# Patient Record
Sex: Male | Born: 1990 | Race: White | Hispanic: No | Marital: Single | Smoking: Never smoker
Health system: Southern US, Community
[De-identification: ages and names within clinical notes are randomized; demographics above are authoritative.]

## PROBLEM LIST (undated history)

## (undated) DIAGNOSIS — J45909 Unspecified asthma, uncomplicated: Secondary | ICD-10-CM

## (undated) DIAGNOSIS — E063 Autoimmune thyroiditis: Secondary | ICD-10-CM

## (undated) HISTORY — DX: Autoimmune thyroiditis: E06.3

## (undated) HISTORY — DX: Unspecified asthma, uncomplicated: J45.909

---
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
## 2015-08-30 ENCOUNTER — Other Ambulatory Visit: Payer: Self-pay | Admitting: *Deleted

## 2015-08-30 NOTE — Telephone Encounter (Signed)
FAXED REFILL TO [facility] FOR VENTOLIN HFA X 1 AND NEEDS APPT

## 2015-10-09 DIAGNOSIS — S93409A Sprain of unspecified ligament of unspecified ankle, initial encounter: Secondary | ICD-10-CM | POA: Diagnosis not present

## 2015-10-09 DIAGNOSIS — M25572 Pain in left ankle and joints of left foot: Secondary | ICD-10-CM | POA: Diagnosis not present

## 2015-11-15 DIAGNOSIS — E063 Autoimmune thyroiditis: Secondary | ICD-10-CM | POA: Diagnosis not present

## 2015-11-15 DIAGNOSIS — J45909 Unspecified asthma, uncomplicated: Secondary | ICD-10-CM | POA: Diagnosis not present

## 2015-11-16 ENCOUNTER — Encounter: Payer: Self-pay | Admitting: Allergy and Immunology

## 2015-11-16 ENCOUNTER — Ambulatory Visit (INDEPENDENT_AMBULATORY_CARE_PROVIDER_SITE_OTHER): Payer: BLUE CROSS/BLUE SHIELD | Admitting: Allergy and Immunology

## 2015-11-16 VITALS — BP 128/72 | HR 60 | Resp 16 | Ht 70.0 in | Wt 122.0 lb

## 2015-11-16 DIAGNOSIS — J4540 Moderate persistent asthma, uncomplicated: Secondary | ICD-10-CM

## 2015-11-16 MED ORDER — DULERA 200-5 MCG/ACT IN AERO: 2.0000 | INHALATION_SPRAY | Freq: Two times a day (BID) | RESPIRATORY_TRACT | Status: DC

## 2015-11-16 MED ORDER — VENTOLIN HFA 108 (90 BASE) MCG/ACT IN AERS: 2.0000 | INHALATION_SPRAY | RESPIRATORY_TRACT | Status: DC | PRN

## 2015-11-16 NOTE — Progress Notes (Signed)
Follow-up Note  Referring Provider: No ref. provider found Primary Provider: Sissy HoffSWAYNE,DAVID W, MD Date of Office Visit: 11/16/2015  Subjective:   Jerry Dalton (DOB: 10/14/1990) is a 25 y.o. male who [facility] in re-evaluation of the following:  HPI: Jerry Dalton returns to this clinic in evaluation of his asthma and history of fixed physiologic pulmonary deficit. He has done quite well over the course of the past year although he did develop a flare of his asthma during the spring that required him to increase his Dulera from once a day to twice a day and uses bronchodilator about twice a day. This occurred for about one week and possibly extending into his second week. Otherwise, he is done very well and is never required a systemic steroid for the past year to control this issue and does not use a short acting bronchodilator and can exercise without any difficulty.    Medication List           DULERA 200-5 MCG/ACT Aero  Generic drug:  mometasone-formoterol  Inhale 2 puffs into the lungs 2 (two) times daily.     VENTOLIN HFA 108 (90 Base) MCG/ACT inhaler  Generic drug:  albuterol  Inhale 2 puffs into the lungs every 4 (four) hours as needed.        Past Medical History  Diagnosis Date  . Asthma   . Hashimoto's thyroiditis     History reviewed. No pertinent past surgical history.  No Known Allergies  Review of systems negative except as noted in HPI / PMHx or noted below:  Review of Systems  Constitutional: Negative.   HENT: Negative.   Eyes: Negative.   Respiratory: Negative.   Cardiovascular: Negative.   Gastrointestinal: Negative.   Genitourinary: Negative.   Musculoskeletal: Negative.   Skin: Negative.   Neurological: Negative.   Endo/Heme/Allergies: Negative.   Psychiatric/Behavioral: Negative.      Objective:   Filed Vitals:   11/16/15 1803  BP: 128/72  Pulse: 60  Resp: 16   Height: 5\' 10"   (177.8 cm)  Weight: 122 lb (55.339 kg)   Physical Exam  Constitutional: He is well-developed, well-nourished, and in no distress.  HENT:  Head: Normocephalic.  Right Ear: Tympanic membrane, external ear and ear canal normal.  Left Ear: Tympanic membrane, external ear and ear canal normal.  Nose: Nose normal. No mucosal edema or rhinorrhea.  Mouth/Throat: Uvula is midline, oropharynx is clear and moist and mucous membranes are normal. No oropharyngeal exudate.  Eyes: Conjunctivae are normal.  Neck: Trachea normal. No tracheal tenderness present. No tracheal deviation present. No thyromegaly present.  Cardiovascular: Normal rate, regular rhythm, S1 normal, S2 normal and normal heart sounds.   No murmur heard. Pulmonary/Chest: Breath sounds normal. No stridor. No respiratory distress. He has no wheezes. He has no rales.  Musculoskeletal: He exhibits no edema.  Lymphadenopathy:       Head (right side): No tonsillar adenopathy present.       Head (left side): No tonsillar adenopathy present.    He has no cervical adenopathy.  Neurological: He is alert. Gait normal.  Skin: No rash noted. He is not diaphoretic. No erythema. Nails show no clubbing.  Psychiatric: Mood and affect normal.    Diagnostics:    Spirometry was performed and demonstrated an FEV1 of 3.14 at 70 % of predicted.  The patient had an Asthma Control Test with the following results: ACT Total Score: 20.  Assessment and Plan:   1. Asthma, moderate persistent, well-controlled     1. Continue Dulera 2 inhalations one or 2 times per day depending on disease activity  2. Continue ProAir HFA 2 puffs every 4-6 hours if needed  3. Obtain annual fall flu vaccine  4. Return to clinic in 1 year or earlier if problem  Jerry Dalton appears to be doing relative well on his current medical plan and I will see him back in this clinic in 1 year or earlier if there is a problem.  Laurette SchimkeEric Jaquanda Wickersham, MD [facility]

## 2015-11-16 NOTE — Patient Instructions (Signed)
  1. Continue Dulera 2 inhalations one or 2 times per day depending on disease activity  2. Continue ProAir HFA 2 puffs every 4-6 hours if needed  3. Obtain annual fall flu vaccine  4. Return to clinic in 1 year or earlier if problem

## 2015-12-14 DIAGNOSIS — J4520 Mild intermittent asthma, uncomplicated: Secondary | ICD-10-CM | POA: Diagnosis not present

## 2015-12-16 ENCOUNTER — Ambulatory Visit (INDEPENDENT_AMBULATORY_CARE_PROVIDER_SITE_OTHER): Payer: BLUE CROSS/BLUE SHIELD | Admitting: Allergy and Immunology

## 2015-12-16 ENCOUNTER — Encounter: Payer: Self-pay | Admitting: Allergy and Immunology

## 2015-12-16 ENCOUNTER — Encounter (INDEPENDENT_AMBULATORY_CARE_PROVIDER_SITE_OTHER): Payer: Self-pay

## 2015-12-16 ENCOUNTER — Other Ambulatory Visit: Payer: Self-pay | Admitting: Allergy and Immunology

## 2015-12-16 VITALS — BP 110/72 | HR 76 | Temp 97.5°F | Resp 16

## 2015-12-16 DIAGNOSIS — J45901 Unspecified asthma with (acute) exacerbation: Secondary | ICD-10-CM | POA: Diagnosis not present

## 2015-12-16 NOTE — Progress Notes (Signed)
FOLLOW UP NOTE  RE: Jerry Dalton MRN: [id number] DOB: Mar 20, 1991 [facility] [address] [place] [address] Date of Office Visit: 12/16/2015  Subjective:  Jerry Dalton is a 25 y.o. male who presents today for Asthma (Cough X 2 days---has used Albuterol six times in 2 days.   "Trouble catching my breath")  Assessment:   1. Persistent asthma with acute exacerbation, in no respiratory distress with normal lung exam and normal oxygenation.  2.      History of restrictive in office spirometry in this slim young man (fixed physiologic pulmonary deficit). 3.      Intermittent rhinitis. 4.      History of thyroiditis. Plan:  1.   Prednisone  today,  tomorrow and Saturday. 2.   Continue Dulera 2 puffs twice daily, rinse mouth with water after use. 4.   Consider further evaluation if persisting symptoms. 5.   Follow-up in 2 weeks or sooner if needed. 6.   Saline nasal wash each evening at bath time. 7.   Consider addition of Singulair  each evening. 8.   Follow-up with primary MD/Endocrine regarding thyroid and weight.  HPI: Jerry Dalton returns to the office after our On call conversation last night with recent cough and concern for taking in big deep breaths. He states all this week he has noted change in his breathing. He feels he needs to take in a "big breath" and think about his breathing.  He feels symptoms begin at work late in the day, where he is exposed to [employer] ([employer]) dust and recent humid weather. He has been working there since May but recently, this week symptoms began bothering him.  He may have noted rare cough but no wheeze, shortness of breath, difficulty in breathing, chest tightness or chest congestion.  He denies fever, sore throat, headache, nasal congestion, rhinorrhea, sneezing or chest pain.  He had used albuterol several times which is beneficial but not quite 100%.  He does not recall any sick  contacts.  He just saw Dr. Lucie Leather last month, and was doing well therefore to maintain on current medication regime.  Denies ED or urgent care visits, prednisone or antibiotic courses. Reports sleep and activity are normal. We did discuss his weight, and he reports normal appetite, and no GI concerns but weight loss over the recent years and has previously followed with Endocrinology.  Jerry Dalton has a current medication list which includes the following prescription(s): mometasone-formoterol and ventolin hfa.   Drug Allergies: No Known Allergies  Objective:   Vitals:   12/16/15 1455  BP: 110/72  Pulse: 76  Resp: 16  Temp: 97.5 F (36.4 C)   SpO2 Readings from Last 1 Encounters:  12/16/15 98%   Physical Exam  Constitutional: He is well-developed, well-nourished, and in no distress.  Alert interactive very slim young man communicating easily in full sentences.  HENT:  Head: Atraumatic.  Right Ear: Tympanic membrane and ear canal normal.  Left Ear: Tympanic membrane and ear canal normal.  Nose: Mucosal edema present. No rhinorrhea. No epistaxis.  Mouth/Throat: Oropharynx is clear and moist and mucous membranes are normal. No oropharyngeal exudate, posterior oropharyngeal edema or posterior oropharyngeal erythema.  Eyes: Conjunctivae are normal.  Neck: Neck supple.  Cardiovascular: Normal rate, S1 normal and S2 normal.   No murmur heard. Pulmonary/Chest: Effort normal and breath sounds normal. No accessory muscle usage. No respiratory distress. He has no decreased breath sounds. He has no  wheezes. He has no rhonchi. He has no rales.  Lymphadenopathy:    He has no cervical adenopathy.  Skin: Skin is warm and intact. No rash noted. No cyanosis. Nails show no clubbing.   Diagnostics: Spirometry:  FVC 3.59--69%, FEV1 3.25--72%, postbronchodilator essentially no change.  ACT=19.    Jerry Dalton M. Willa Rough, MD  cc: Sissy Hoff, MD

## 2015-12-16 NOTE — Patient Instructions (Addendum)
    Prednisone 30mg  today, 20mg  tomorrow and Saturday.  Continue Dulera 2 puffs twice daily.  Consider further evaluation if persisting symptoms.  Follow-up in 2 weeks or sooner if needed.  Saline nasal wash each evening at bath time.  Consider Singulair 10mg  each evening.  Follow-up with Endocrine regarding thyroid and weight.

## 2016-03-17 DIAGNOSIS — Z23 Encounter for immunization: Secondary | ICD-10-CM | POA: Diagnosis not present

## 2016-06-13 DIAGNOSIS — J069 Acute upper respiratory infection, unspecified: Secondary | ICD-10-CM | POA: Diagnosis not present

## 2016-11-05 ENCOUNTER — Other Ambulatory Visit: Payer: Self-pay | Admitting: Allergy and Immunology

## 2016-12-01 ENCOUNTER — Telehealth: Payer: Self-pay | Admitting: Allergy and Immunology

## 2016-12-01 NOTE — Telephone Encounter (Signed)
I tried to call Jerry Dalton but his voicemail is not set up. I can not call his mother back because their is no DPR on file.

## 2016-12-01 NOTE — Telephone Encounter (Signed)
Patient's mom called and made an appt for her sone for Monday, 7-16. He is at [employer] and is having some breathing issues. She said he could not come today, but Monday was fine. She needs advice on what he can do over the weekend if it becomes worse. I told her Urgent Care and she said he has gone there twice and they won't do anything for him and tell him to go to the hospital. She wants to know if there is an inhaler that could be called in or any advice you might have for him. She said you can either call her or, Jerry Dalton and it is ok to leave a message. [name]'s [phone]. Mom's [phone].

## 2016-12-04 ENCOUNTER — Ambulatory Visit (INDEPENDENT_AMBULATORY_CARE_PROVIDER_SITE_OTHER): Payer: BLUE CROSS/BLUE SHIELD | Admitting: Allergy

## 2016-12-04 ENCOUNTER — Encounter: Payer: Self-pay | Admitting: Allergy

## 2016-12-04 VITALS — BP 118/70 | HR 63 | Temp 98.0°F | Resp 18 | Ht 70.75 in | Wt 131.6 lb

## 2016-12-04 DIAGNOSIS — J4540 Moderate persistent asthma, uncomplicated: Secondary | ICD-10-CM | POA: Diagnosis not present

## 2016-12-04 MED ORDER — MOMETASONE FURO-FORMOTEROL FUM 200-5 MCG/ACT IN AERO: INHALATION_SPRAY | RESPIRATORY_TRACT | 5 refills | Status: DC

## 2016-12-04 MED ORDER — MONTELUKAST SODIUM 10 MG PO TABS: 10.0000 mg | ORAL_TABLET | Freq: Every day | ORAL | 5 refills | Status: DC

## 2016-12-04 MED ORDER — VENTOLIN HFA 108 (90 BASE) MCG/ACT IN AERS: 2.0000 | INHALATION_SPRAY | RESPIRATORY_TRACT | 1 refills | Status: DC | PRN

## 2016-12-04 NOTE — Progress Notes (Signed)
Follow-up Note  RE: Jerry Dalton MRN: [id number] DOB: 07/22/1990 Date of Office Visit: 12/04/2016   History of present illness: Jerry Dalton is a 26 y.o. male presenting today for follow-up of moderate persistent asthma. The patient's last visit was July 2017. At that time he was treated for an acute exacerbation with prednisone. Today the patient states that he has been experiencing increased shortness of breath, chest tightness, and cough over the past two weeks. He denies wheezing or night symptoms. He states his albuterol use has varied these past two weeks. He states some days he does not need to use it at all and other days he will use it 2-3 times per day. He endorses exercise as a trigger, but uses his albuterol inhaler after exercise rather than 30 minutes prior. One week ago he increased his Dulera from two puffs once daily to two puffs twice daily, which he states has helped. He endorses that his triggers include heat, exercise/physical exertion, and stress.   Since his last visit in July 2017, he has not had any ED visits, hospitalizations, or prednisone treatment for his asthma. The patient states that his allergies are very mild and have not been an issue.   Review of systems: Review of Systems  Constitutional: Negative.   HENT: Negative.   Eyes: Negative.   Respiratory: Positive for cough and shortness of breath.   Cardiovascular: Negative.   Gastrointestinal: Negative.   Genitourinary: Negative.   Musculoskeletal: Negative.   Skin: Negative.   Neurological: Negative.   Endo/Heme/Allergies: Negative.   Psychiatric/Behavioral: Negative.     All other systems negative unless noted above in HPI  Past medical/social/surgical/family history have been reviewed and are unchanged unless specifically indicated below.  No changes  Medication List: Allergies as of 12/04/2016   No Known Allergies     Medication List       Accurate as of 12/04/16  1:07 PM.  Always use your most recent med list.          mometasone-formoterol 200-5 MCG/ACT Aero Commonly known as:  DULERA INHALE TWO PUFFS TWICE DAILY TO PREVENT COUGH OR WHEEZE   VENTOLIN HFA 108 (90 Base) MCG/ACT inhaler Generic drug:  albuterol Inhale 2 puffs into the lungs every 4 (four) hours as needed.       Known medication allergies: No Known Allergies   Physical examination: Blood pressure 118/70, pulse 63, temperature 98 F (36.7 C), temperature source Oral, resp. rate 18, height 5' 10.75" (1.797 m), weight 131 lb 9.6 oz (59.7 kg), SpO2 97 %.  General: Alert, interactive, in no acute distress. HEENT: TMs pearly gray, turbinates non-edematous without discharge, post-pharynx unremarkable. Neck: Supple without lymphadenopathy. Lungs: Clear to auscultation without wheezing, rhonchi or rales. {no increased work of breathing. CV: Normal S1, S2 without murmurs. Abdomen: Nondistended, nontender. Skin: Warm and dry, without lesions or rashes. Extremities:  No clubbing, cyanosis or edema. Neuro:   Grossly intact.  Diagnositics/Labs Labs:  CBC, IgE, Environmental panel  Spirometry: FEV1: 2.75/59, FVC: 3.25/60%, ratio consistent with moderate restriction. No significant improvement post albuterol treatment.  Study similar from previous study  Allergy testing: None   Assessment and plan:   1. Moderate Persistent Asthma - at this time not well controlled -Continue Dulera 200 mcg two puffs twice daily. Albuterol as needed, and 30 minutes prior to physical activity -Start Singulair 10 mg daily.  -Obtain labs: CBC, IgE, and environmental panel - if lung function worsen or symptoms become more progressive  consider imaging with A1AT testing -Asthma control goals:   Full participation in all desired activities (may need albuterol before activity)  Albuterol use two time or less a week on average (not counting use with activity)  Cough interfering with sleep two time or less a  month  Oral steroids no more than once a year   2. Intermittent rhinitis  -Singulair 10 mg daily as above -Saline rinses daily   Follow up with Dr. Lucie Leather in 3-4 months.    Landis Martins, MD Internal Medicine PGY1  I performed a history and physical examination of the patient and discussed management with the resident. I reviewed the resident's note and agree with the documented findings and plan of care. The note in its entirety was edited by myself, including the physical exam, assessment, and plan.   Margo Aye, MD [facility]

## 2016-12-04 NOTE — Telephone Encounter (Signed)
Patient is in the office today being seen.

## 2016-12-04 NOTE — Patient Instructions (Addendum)
1. Moderate Persistent Asthma -Continue Dulera 200 mcg two puffs twice daily. Albuterol as needed, and 30 minutes prior to physical activity -Start Singulair 10 mg daily.  -Obtain labs: CBC, IgE, and environmental panel -Asthma control goals:   Full participation in all desired activities (may need albuterol before activity)  Albuterol use two time or less a week on average (not counting use with activity)  Cough interfering with sleep two time or less a month  Oral steroids no more than once a year   2. Intermittent rhinitis  -Singulair 10 mg daily as above -Saline rinses daily   Follow up with Dr. Lucie LeatherKozlow in 3-4 months.

## 2016-12-12 DIAGNOSIS — H52223 Regular astigmatism, bilateral: Secondary | ICD-10-CM | POA: Diagnosis not present

## 2016-12-29 DIAGNOSIS — Z0000 Encounter for general adult medical examination without abnormal findings: Secondary | ICD-10-CM | POA: Diagnosis not present

## 2017-06-09 ENCOUNTER — Other Ambulatory Visit: Payer: Self-pay | Admitting: Internal Medicine

## 2017-06-09 ENCOUNTER — Other Ambulatory Visit: Payer: Self-pay | Admitting: Allergy

## 2017-06-09 DIAGNOSIS — J4540 Moderate persistent asthma, uncomplicated: Secondary | ICD-10-CM

## 2017-06-11 NOTE — Telephone Encounter (Signed)
Courtesy refill  

## 2017-06-14 ENCOUNTER — Telehealth: Payer: Self-pay | Admitting: Allergy

## 2017-06-14 MED ORDER — MOMETASONE FURO-FORMOTEROL FUM 200-5 MCG/ACT IN AERO: INHALATION_SPRAY | RESPIRATORY_TRACT | 0 refills | Status: DC

## 2017-06-14 NOTE — Telephone Encounter (Signed)
[facility]

## 2017-06-14 NOTE — Telephone Encounter (Signed)
Pt called and needs to [facility] Dul[facility] [place] [phone].

## 2017-06-15 NOTE — Telephone Encounter (Signed)
Gave patient a sample of Dulera on 06/15/2017.

## 2017-07-08 ENCOUNTER — Other Ambulatory Visit: Payer: Self-pay | Admitting: Allergy and Immunology

## 2017-07-08 DIAGNOSIS — J4540 Moderate persistent asthma, uncomplicated: Secondary | ICD-10-CM

## 2017-07-20 ENCOUNTER — Telehealth: Payer: Self-pay | Admitting: Allergy

## 2017-07-20 MED ORDER — MOMETASONE FURO-FORMOTEROL FUM 200-5 MCG/ACT IN AERO: INHALATION_SPRAY | RESPIRATORY_TRACT | 0 refills | Status: DC

## 2017-07-20 NOTE — Telephone Encounter (Signed)
[facility] Will not refill if patient does not keep appointment.

## 2017-07-20 NOTE — Telephone Encounter (Signed)
Pt called and made appointment with anna on 03.6.2019. Needs to have Dulera [facility] [address] [phone].

## 2017-07-23 ENCOUNTER — Telehealth: Payer: Self-pay | Admitting: Allergy

## 2017-07-23 ENCOUNTER — Other Ambulatory Visit: Payer: Self-pay | Admitting: *Deleted

## 2017-07-23 DIAGNOSIS — J4540 Moderate persistent asthma, uncomplicated: Secondary | ICD-10-CM

## 2017-07-23 MED ORDER — MONTELUKAST SODIUM 10 MG PO TABS: ORAL_TABLET | ORAL | 0 refills | Status: DC

## 2017-07-23 NOTE — Telephone Encounter (Signed)
Rx sent 

## 2017-07-23 NOTE — Telephone Encounter (Signed)
Patient is requesting a refill on montelukast. He has an appt on 07-25-17.

## 2017-07-25 ENCOUNTER — Ambulatory Visit (INDEPENDENT_AMBULATORY_CARE_PROVIDER_SITE_OTHER): Payer: BLUE CROSS/BLUE SHIELD | Admitting: Family Medicine

## 2017-07-25 ENCOUNTER — Encounter: Payer: Self-pay | Admitting: Family Medicine

## 2017-07-25 VITALS — BP 118/70 | HR 71 | Resp 18

## 2017-07-25 DIAGNOSIS — Z8639 Personal history of other endocrine, nutritional and metabolic disease: Secondary | ICD-10-CM | POA: Insufficient documentation

## 2017-07-25 DIAGNOSIS — J310 Chronic rhinitis: Secondary | ICD-10-CM

## 2017-07-25 DIAGNOSIS — J4540 Moderate persistent asthma, uncomplicated: Secondary | ICD-10-CM

## 2017-07-25 MED ORDER — MONTELUKAST SODIUM 10 MG PO TABS: ORAL_TABLET | ORAL | 5 refills | Status: DC

## 2017-07-25 MED ORDER — MOMETASONE FURO-FORMOTEROL FUM 200-5 MCG/ACT IN AERO
INHALATION_SPRAY | RESPIRATORY_TRACT | 5 refills | Status: DC
Start: 2017-07-25 — End: 2018-03-07

## 2017-07-25 MED ORDER — VENTOLIN HFA 108 (90 BASE) MCG/ACT IN AERS: 2.0000 | INHALATION_SPRAY | RESPIRATORY_TRACT | 1 refills | Status: DC | PRN

## 2017-07-25 NOTE — Progress Notes (Signed)
[address] [place] [address] Dept: [phone]  FOLLOW UP NOTE  Patient ID: Jerry Dalton, male    DOB: 12/27/1990  Age: 27 y.o. MRN: [id number] Date of Office Visit: 07/25/2017  Assessment  Chief Complaint: Asthma and Medication Refill  HPI Jerry BishopBenjamin N Brandi is a 27 year old male who presents to the clinic today for follow-up.  He was last seen in this clinic on 12/04/2016 by Dr. Delorse LekPadgett for evaluation of moderate persistent asthma and allergic rhinitis.  At that time he was continued on Dulera 200-2 puffs twice a day and albuterol 2 puffs every 4 hours as needed.  At that visit, he was started on montelukast 10 mg once a day.  At today's visit, he reports he is doing well with his asthma and his rhinitis. Quamir's asthma has been well controlled. He has not required rescue medication, experienced nocturnal awakenings due to lower respiratory symptoms, nor have activities of daily living been limited. He has required no Emergency Department or Urgent Care visits for his asthma. He has required zero courses of systemic steroids for asthma exacerbations since the last visit. ACT score today is 22, indicating excellent asthma symptom control.  He reports his use of pro-air varies and he has used his ProAir inhaler 1 time in the last week for an episode of shortness of breath which provided relief.  Allergic rhinitis is reported as well controlled with no medical intervention.   Current medications include montelukast 10 mg once a day, Dulera 200-2 puffs twice a day, and albuterol inhaler 2 puffs every 4 hours as needed.   Drug Allergies:  No Known Allergies  Physical Exam: BP 118/70 (BP Location: Left Arm, Patient Position: Sitting)   Pulse 71   Resp 18   SpO2 95%    Physical Exam  Constitutional: He is oriented to person, place, and time. He appears well-developed and well-nourished.  HENT:  Right Ear: External ear normal.  Left Ear: External ear normal.    Nose: Nose normal.  Mouth/Throat: Oropharynx is clear and moist.  Eyes: Conjunctivae are normal.  Neck: Normal range of motion. Neck supple.  Cardiovascular: Normal rate, regular rhythm and normal heart sounds.  S1-S2 normal.  Regular heart rate and rhythm.  No murmur noted.  Pulmonary/Chest: Effort normal and breath sounds normal.  Lungs clear to auscultation  Musculoskeletal: Normal range of motion.  Neurological: He is alert and oriented to person, place, and time.  Skin: Skin is warm and dry.  Psychiatric: He has a normal mood and affect. His behavior is normal.    Diagnostics: FVC 3.85, FEV1 3.14.  Predicted FVC 5.01, predicted FEV1 3.73.  Spirometry indicates mild restriction which is similar to the previous visit.  Assessment and Plan: 1. History of thyroiditis   2. Moderate persistent asthma without complication   3. Rhinitis, unspecified type     Meds ordered this encounter  Medications  . mometasone-formoterol (DULERA) 200-5 MCG/ACT AERO    Sig: INHALE TWO PUFFS TWICE DAILY TO PREVENT COUGH OR WHEEZE    Dispense:  13 g    Refill:  5  . montelukast (SINGULAIR) 10 MG tablet    Sig: Take one tablet once daily    Dispense:  30 tablet    Refill:  5  . VENTOLIN HFA 108 (90 Base) MCG/ACT inhaler    Sig: Inhale 2 puffs into the lungs every 4 (four) hours as needed.    Dispense:  18 g    Refill:  1  Patient Instructions  1. Moderate Persistent Asthma -Continue Dulera 200 mcg two puffs twice daily with a spacer.  - Albuterol 2 puffs every 4 hours as needed, and 30 minutes prior to physical activity -Continue Singulair 10 mg daily.  -Asthma control goals:   Full participation in all desired activities (may need albuterol before activity)  Albuterol use two time or less a week on average (not counting use with activity)  Cough interfering with sleep two time or less a month  Oral steroids no more than once a year   2. Intermittent rhinitis  -Singulair 10 mg  daily as above -Saline rinses daily   Follow up in 6 months or sooner if needed   Return in about 6 months (around 01/25/2018), or if symptoms worsen or fail to improve.    Thank you for the opportunity to care for this patient.  Please do not hesitate to contact me with questions.  Thermon Leyland, FNP [facility]

## 2017-07-25 NOTE — Patient Instructions (Signed)
1. Moderate Persistent Asthma -Continue Dulera 200 mcg two puffs twice daily with a spacer.  - Albuterol 2 puffs every 4 hours as needed, and 30 minutes prior to physical activity -Continue Singulair 10 mg daily.  -Asthma control goals:   Full participation in all desired activities (may need albuterol before activity)  Albuterol use two time or less a week on average (not counting use with activity)  Cough interfering with sleep two time or less a month  Oral steroids no more than once a year   2. Intermittent rhinitis  -Singulair 10 mg daily as above -Saline rinses daily   Follow up in 6 months or sooner if needed

## 2018-02-21 DIAGNOSIS — H5213 Myopia, bilateral: Secondary | ICD-10-CM | POA: Diagnosis not present

## 2018-02-27 ENCOUNTER — Other Ambulatory Visit: Payer: Self-pay | Admitting: Family Medicine

## 2018-02-27 DIAGNOSIS — J4540 Moderate persistent asthma, uncomplicated: Secondary | ICD-10-CM

## 2018-02-27 NOTE — Telephone Encounter (Signed)
Courtesy refill  

## 2018-03-02 ENCOUNTER — Other Ambulatory Visit: Payer: Self-pay | Admitting: Family Medicine

## 2018-03-06 ENCOUNTER — Other Ambulatory Visit: Payer: Self-pay | Admitting: Family Medicine

## 2018-03-07 ENCOUNTER — Telehealth: Payer: Self-pay | Admitting: Allergy

## 2018-03-07 MED ORDER — MOMETASONE FURO-FORMOTEROL FUM 200-5 MCG/ACT IN AERO: INHALATION_SPRAY | RESPIRATORY_TRACT | 0 refills | Status: DC

## 2018-03-07 NOTE — Telephone Encounter (Signed)
Sent final courtesy refill.

## 2018-03-07 NOTE — Telephone Encounter (Signed)
Patient will need OV 

## 2018-03-07 NOTE — Telephone Encounter (Signed)
Pt called and made appointment for oct 21/2019. [facility] [phone].

## 2018-03-09 DIAGNOSIS — H11431 Conjunctival hyperemia, right eye: Secondary | ICD-10-CM | POA: Diagnosis not present

## 2018-03-11 ENCOUNTER — Ambulatory Visit: Payer: BLUE CROSS/BLUE SHIELD | Admitting: Allergy

## 2018-03-11 DIAGNOSIS — J309 Allergic rhinitis, unspecified: Secondary | ICD-10-CM

## 2018-04-02 ENCOUNTER — Other Ambulatory Visit: Payer: Self-pay | Admitting: Family Medicine

## 2018-04-02 ENCOUNTER — Other Ambulatory Visit: Payer: Self-pay | Admitting: Allergy

## 2018-04-02 DIAGNOSIS — J4540 Moderate persistent asthma, uncomplicated: Secondary | ICD-10-CM

## 2018-04-08 ENCOUNTER — Telehealth: Payer: Self-pay | Admitting: Allergy

## 2018-04-08 NOTE — Telephone Encounter (Signed)
Called number and received a message stating that phone number does not receive calls due to restrictions.

## 2018-04-08 NOTE — Telephone Encounter (Signed)
Pt called and made appointment for this wed 04/10/2018 and needs to have singulair and sulera [facility] [place]   [phone].

## 2018-04-10 ENCOUNTER — Ambulatory Visit (INDEPENDENT_AMBULATORY_CARE_PROVIDER_SITE_OTHER): Payer: BLUE CROSS/BLUE SHIELD | Admitting: Allergy

## 2018-04-10 ENCOUNTER — Encounter: Payer: Self-pay | Admitting: Allergy

## 2018-04-10 VITALS — BP 110/68 | HR 68 | Resp 17 | Ht 70.5 in | Wt 129.4 lb

## 2018-04-10 DIAGNOSIS — J4540 Moderate persistent asthma, uncomplicated: Secondary | ICD-10-CM | POA: Diagnosis not present

## 2018-04-10 DIAGNOSIS — J310 Chronic rhinitis: Secondary | ICD-10-CM | POA: Diagnosis not present

## 2018-04-10 MED ORDER — VENTOLIN HFA 108 (90 BASE) MCG/ACT IN AERS: 2.0000 | INHALATION_SPRAY | RESPIRATORY_TRACT | 1 refills | Status: DC | PRN

## 2018-04-10 MED ORDER — MOMETASONE FURO-FORMOTEROL FUM 200-5 MCG/ACT IN AERO: INHALATION_SPRAY | RESPIRATORY_TRACT | 5 refills | Status: DC

## 2018-04-10 MED ORDER — MONTELUKAST SODIUM 10 MG PO TABS: ORAL_TABLET | ORAL | 5 refills | Status: DC

## 2018-04-10 NOTE — Assessment & Plan Note (Signed)
Well-controlled with below regimen but ran out of medications a few days ago. Today's spirometry was normal but not as good as previous ones. - Continue Dulera 200 mcg two puffs twice daily with a spacer.  - Continue Singulair 10 mg daily.  - May use albuterol rescue inhaler 2 puffs or nebulizer every 4 to 6 hours as needed for shortness of breath, chest tightness, coughing, and wheezing. May use albuterol rescue inhaler 2 puffs 5 to 15 minutes prior to strenuous physical activities. - Asthma control goals:   Full participation in all desired activities (may need albuterol before activity)  Albuterol use two time or less a week on average (not counting use with activity)  Cough interfering with sleep two time or less a month  Oral steroids no more than once a year

## 2018-04-10 NOTE — Progress Notes (Signed)
Follow Up Note  RE: Jerry Dalton N Stemmer MRN: [id number] DOB: 01/21/1991 Date of Office Visit: 04/10/2018  Referring provider: Tally JoeSwayne, David, MD Primary care provider: Tally JoeSwayne, David, MD  Chief Complaint: Asthma  History of Present Illness: I had the pleasure of seeing Jerry Dalton for a follow up visit at the [facility] on 04/10/2018. He is a 27 y.o. male, who is being followed for asthma and rhinitis. Today he is here for regular follow up visit. His previous allergy office visit was on 07/25/2017 with Thermon LeylandAnne Ambs, FNP.   1. Moderate Persistent Asthma Currently on Dulera 200 2 puffs BID but the last few days only used 1 puff BID as he was running out of the medication and he also ran out of Singulair as well. He has not noticed any clinical difference yet.   Denies any SOB, coughing, wheezing, chest tightness, nocturnal awakenings, ER/urgent care visits or prednisone use since the last visit.  2. Allergic rhinitis Only was taking Singulair daily with good control.   Assessment and Plan: Jerry Dalton is a 27 y.o. male with: Moderate persistent asthma without complication Well-controlled with below regimen but ran out of medications a few days ago. Today's spirometry was normal but not as good as previous ones. - Continue Dulera 200 mcg two puffs twice daily with a spacer.  - Continue Singulair 10 mg daily.  - May use albuterol rescue inhaler 2 puffs or nebulizer every 4 to 6 hours as needed for shortness of breath, chest tightness, coughing, and wheezing. May use albuterol rescue inhaler 2 puffs 5 to 15 minutes prior to strenuous physical activities. - Asthma control goals:   Full participation in all desired activities (may need albuterol before activity)  Albuterol use two time or less a week on average (not counting use with activity)  Cough interfering with sleep two time or less a month  Oral steroids no more than once a year  Chronic rhinitis No  recent allergy evaluation. Well-controlled with Singulair 10mg  daily and may continue to do so.   Return in about 6 months (around 10/09/2018).  Meds ordered this encounter  Medications  . mometasone-formoterol (DULERA) 200-5 MCG/ACT AERO    Sig: INHALE TWO PUFFS TWICE DAILY TO PREVENT COUGH OR WHEEZE    Dispense:  13 g    Refill:  5  . montelukast (SINGULAIR) 10 MG tablet    Sig: TAKE 1 TABLET BY MOUTH EVERY DAY    Dispense:  30 tablet    Refill:  5  . VENTOLIN HFA 108 (90 Base) MCG/ACT inhaler    Sig: Inhale 2 puffs into the lungs every 4 (four) hours as needed.    Dispense:  18 g    Refill:  1   Diagnostics: Spirometry:  Tracings reviewed. His effort: Good reproducible efforts. FVC: 3.87L FEV1: 3.27L, 69% predicted FEV1/FVC ratio: 84% Interpretation: Spirometry consistent with possible restrictive disease and worse than previous one. Please see scanned spirometry results for details.  Medication List:  Current Outpatient Medications  Medication Sig Dispense Refill  . mometasone-formoterol (DULERA) 200-5 MCG/ACT AERO INHALE TWO PUFFS TWICE DAILY TO PREVENT COUGH OR WHEEZE 13 g 5  . montelukast (SINGULAIR) 10 MG tablet TAKE 1 TABLET BY MOUTH EVERY DAY 30 tablet 5  . VENTOLIN HFA 108 (90 Base) MCG/ACT inhaler Inhale 2 puffs into the lungs every 4 (four) hours as needed. 18 g 1   No current facility-administered medications for this visit.    Allergies: No Known  Allergies I reviewed his past medical history, social history, family history, and environmental history and no significant changes have been reported from previous visit on 07/25/2017.  Review of Systems  Constitutional: Negative for appetite change, chills, fever and unexpected weight change.  HENT: Negative for congestion and rhinorrhea.   Eyes: Negative for itching.  Respiratory: Negative for cough, chest tightness, shortness of breath and wheezing.   Gastrointestinal: Negative for abdominal pain.  Skin:  Negative for rash.  Neurological: Negative for headaches.   Objective: BP 110/68 (BP Location: Left Arm, Patient Position: Sitting, Cuff Size: Normal)   Pulse 68   Resp 17   Ht 5' 10.5" (1.791 m)   Wt 129 lb 6.4 oz (58.7 kg)   SpO2 98%   BMI 18.30 kg/m  Body mass index is 18.3 kg/m. Physical Exam  Constitutional: He is oriented to person, place, and time. He appears well-developed and well-nourished.  HENT:  Head: Normocephalic and atraumatic.  Right Ear: External ear normal.  Left Ear: External ear normal.  Nose: Nose normal.  Mouth/Throat: Oropharynx is clear and moist.  Eyes: Conjunctivae and EOM are normal.  Neck: Neck supple.  Cardiovascular: Normal rate, regular rhythm and normal heart sounds. Exam reveals no gallop and no friction rub.  No murmur heard. Pulmonary/Chest: Effort normal and breath sounds normal. He has no wheezes. He has no rales.  Lymphadenopathy:    He has no cervical adenopathy.  Neurological: He is alert and oriented to person, place, and time.  Skin: Skin is warm. No rash noted.  Psychiatric: He has a normal mood and affect. His behavior is normal.  Nursing note and vitals reviewed.  Previous notes and tests were reviewed. The plan was reviewed with the patient/family, and all questions/concerned were addressed.  It was my pleasure to see Jerry Dalton today and participate in his care. Please feel free to contact me with any questions or concerns.  Sincerely,  Wyline Mood, DO Allergy & Immunology  [facility] office: [phone] [facility] [phone]

## 2018-04-10 NOTE — Patient Instructions (Signed)
1. Moderate Persistent Asthma -Continue Dulera 200 mcg two puffs twice daily with a spacer.  - Albuterol 2 puffs every 4 hours as needed, and 30 minutes prior to physical activity -Continue Singulair 10 mg daily.  -Asthma control goals:   Full participation in all desired activities (may need albuterol before activity)  Albuterol use two time or less a week on average (not counting use with activity)  Cough interfering with sleep two time or less a month  Oral steroids no more than once a year  2. Intermittent rhinitis  -Singulair 10 mg daily as above

## 2018-04-10 NOTE — Assessment & Plan Note (Signed)
No recent allergy evaluation. Well-controlled with Singulair 10mg  daily and may continue to do so.

## 2018-04-12 NOTE — Telephone Encounter (Signed)
Rx filled during patients appointment 11/20.

## 2018-07-11 ENCOUNTER — Telehealth: Payer: Self-pay | Admitting: Allergy

## 2018-07-11 NOTE — Telephone Encounter (Signed)
Patient informed that we do not have any samples currently.

## 2018-07-11 NOTE — Telephone Encounter (Signed)
Patient is taking Dulera and is requesting a sample for this. He said he has not met his deductible and it is too expensive right now.

## 2018-08-12 ENCOUNTER — Other Ambulatory Visit: Payer: Self-pay

## 2018-08-12 MED ORDER — MOMETASONE FURO-FORMOTEROL FUM 200-5 MCG/ACT IN AERO: INHALATION_SPRAY | RESPIRATORY_TRACT | 2 refills | Status: DC

## 2018-08-12 NOTE — Telephone Encounter (Signed)
Refill sent in called patient no answer could not leave message

## 2018-08-12 NOTE — Telephone Encounter (Signed)
Patient is calling to get a refill on [facility]

## 2018-08-12 NOTE — Telephone Encounter (Signed)
Patient called back Jerry Dalton advised medications were sent in

## 2018-08-16 ENCOUNTER — Telehealth: Payer: Self-pay

## 2018-08-16 NOTE — Telephone Encounter (Signed)
Pt calling asking where he can be tested for COVID-19.Pt states that he has been having a low grade fever, dizziness, and SOB. Explained to patient that he can contact his PCP, the health department or the ER.  Also, explained to patient that we at [facility] are not testing for COVID-19.  Pt verbalized understanding and will contact any of the 3 suggestions that were given.

## 2018-10-15 ENCOUNTER — Other Ambulatory Visit: Payer: Self-pay

## 2018-10-15 DIAGNOSIS — J4540 Moderate persistent asthma, uncomplicated: Secondary | ICD-10-CM

## 2018-10-15 MED ORDER — MONTELUKAST SODIUM 10 MG PO TABS: ORAL_TABLET | ORAL | 0 refills | Status: DC

## 2018-11-03 ENCOUNTER — Other Ambulatory Visit: Payer: Self-pay | Admitting: Allergy

## 2018-11-11 ENCOUNTER — Other Ambulatory Visit: Payer: Self-pay | Admitting: *Deleted

## 2018-11-11 MED ORDER — DULERA 200-5 MCG/ACT IN AERO: INHALATION_SPRAY | RESPIRATORY_TRACT | 0 refills | Status: DC

## 2018-11-11 NOTE — Telephone Encounter (Signed)
Courtesy refill  

## 2018-11-26 ENCOUNTER — Other Ambulatory Visit: Payer: Self-pay | Admitting: Allergy and Immunology

## 2018-11-26 DIAGNOSIS — J4540 Moderate persistent asthma, uncomplicated: Secondary | ICD-10-CM

## 2018-12-02 ENCOUNTER — Other Ambulatory Visit: Payer: Self-pay | Admitting: Allergy

## 2018-12-11 ENCOUNTER — Other Ambulatory Visit: Payer: Self-pay | Admitting: *Deleted

## 2018-12-11 DIAGNOSIS — J4540 Moderate persistent asthma, uncomplicated: Secondary | ICD-10-CM

## 2018-12-11 MED ORDER — MONTELUKAST SODIUM 10 MG PO TABS: ORAL_TABLET | ORAL | 0 refills | Status: DC

## 2018-12-11 MED ORDER — VENTOLIN HFA 108 (90 BASE) MCG/ACT IN AERS: 2.0000 | INHALATION_SPRAY | RESPIRATORY_TRACT | 0 refills | Status: DC | PRN

## 2018-12-11 MED ORDER — DULERA 200-5 MCG/ACT IN AERO: INHALATION_SPRAY | RESPIRATORY_TRACT | 0 refills | Status: DC

## 2018-12-20 ENCOUNTER — Encounter: Payer: Self-pay | Admitting: Family Medicine

## 2018-12-20 ENCOUNTER — Other Ambulatory Visit: Payer: Self-pay

## 2018-12-20 ENCOUNTER — Ambulatory Visit (INDEPENDENT_AMBULATORY_CARE_PROVIDER_SITE_OTHER): Payer: BC Managed Care – PPO | Admitting: Family Medicine

## 2018-12-20 VITALS — BP 118/78 | HR 64 | Resp 16 | Ht 69.5 in | Wt 120.4 lb

## 2018-12-20 DIAGNOSIS — J4540 Moderate persistent asthma, uncomplicated: Secondary | ICD-10-CM

## 2018-12-20 DIAGNOSIS — J310 Chronic rhinitis: Secondary | ICD-10-CM | POA: Diagnosis not present

## 2018-12-20 MED ORDER — VENTOLIN HFA 108 (90 BASE) MCG/ACT IN AERS: 2.0000 | INHALATION_SPRAY | RESPIRATORY_TRACT | 1 refills | Status: AC | PRN

## 2018-12-20 MED ORDER — MONTELUKAST SODIUM 10 MG PO TABS: ORAL_TABLET | ORAL | 5 refills | Status: AC

## 2018-12-20 MED ORDER — DULERA 200-5 MCG/ACT IN AERO: INHALATION_SPRAY | RESPIRATORY_TRACT | 5 refills | Status: AC

## 2018-12-20 NOTE — Progress Notes (Signed)
[place] [address] Dept: [phone]  FOLLOW UP NOTE  Patient ID: Jerry Dalton, male    DOB: 07-19-1990  Age: 28 y.o. MRN: [id number] Date of Office Visit: 12/20/2018  Assessment  Chief Complaint: Asthma  HPI Jerry Dalton is a 28 year old male who presents to the clinic for a follow up visit. He was last seen in this clinic on 04/10/2018 by Dr. Maudie Mercury for evaluation of asthma and allergic rhinitis. At today's visit, he reports his asthma has been well controlled with the exception of one day last week when he experienced shortness of breath for which he used albuterol with resolution of symptoms. Other than last week, he reports no shortness of breath, cough, or wheeze with activity or rest. He continues Dulera 200-2 puffs twice a day with a spacer, montelukast 10 mg once a day, and albuterol once every few months. Allergic rhinitis is reported as well controlled with montelukast. His current medications are listed in the chart.    Drug Allergies:  No Known Allergies  Physical Exam: BP 118/78   Pulse 64   Resp 16   Ht 5' 9.5" (1.765 m)   Wt 120 lb 6.4 oz (54.6 kg)   SpO2 95%   BMI 17.53 kg/m    Physical Exam Vitals signs reviewed.  Constitutional:      Appearance: Normal appearance.  HENT:     Head: Normocephalic and atraumatic.     Right Ear: Tympanic membrane normal.     Left Ear: Tympanic membrane normal.     Nose:     Comments: Bilateral nares slightly erythematous with no nasal drainage noted. Pharynx normal. Ears normal. Eyes normal.    Mouth/Throat:     Pharynx: Oropharynx is clear.  Eyes:     Conjunctiva/sclera: Conjunctivae normal.  Neck:     Musculoskeletal: Normal range of motion and neck supple.  Cardiovascular:     Rate and Rhythm: Normal rate and regular rhythm.     Heart sounds: Normal heart sounds. No murmur.  Pulmonary:     Effort: Pulmonary effort is normal.     Breath sounds: Normal breath sounds.   Comments: Lungs clear to auscultation Musculoskeletal: Normal range of motion.  Skin:    General: Skin is warm and dry.  Neurological:     Mental Status: He is alert and oriented to person, place, and time.  Psychiatric:        Mood and Affect: Mood normal.        Behavior: Behavior normal.        Thought Content: Thought content normal.        Judgment: Judgment normal.     Diagnostics: FVC 3.93, FEV1 3.04. Predicted FVC 5.56, predicted FEV1 4.59. Spirometry indicates mild restriction. This is consistent with previous spirometry readings.    Assessment and Plan: 1. Moderate persistent asthma without complication   2. Chronic rhinitis     Meds ordered this encounter  Medications  . mometasone-formoterol (DULERA) 200-5 MCG/ACT AERO    Sig: INHALE TWO PUFFS TWICE DAILY TO PREVENT COUGH OR WHEEZE    Dispense:  13 g    Refill:  5  . montelukast (SINGULAIR) 10 MG tablet    Sig: TAKE 1 TABLET BY MOUTH EVERY DAY    Dispense:  30 tablet    Refill:  5  . VENTOLIN HFA 108 (90 Base) MCG/ACT inhaler    Sig: Inhale 2 puffs into the lungs every 4 (four) hours  as needed.    Dispense:  18 g    Refill:  1    Patient Instructions   Moderate Persistent Asthma -Continue Dulera 200 mcg two puffs twice daily with a spacer to prevent cough or wheeze - Albuterol 2 puffs every 4 hours as needed, and 30 minutes prior to physical activity -Continue Singulair 10 mg daily Asthma control goals:   Full participation in all desired activities (may need albuterol before activity)  Albuterol use two time or less a week on average (not counting use with activity)  Cough interfering with sleep two time or less a month  Oral steroids no more than once a year  No hospitalizations  Allergic rhinitis Continue Singulair as above Flonase 1-2 sprays in each nostril once a day as needed for a stuffy nose Consider nasal rinses as needed for nasal symptoms  Call the clinic if this treatment plan is not  working well for you  Follow up in 6 months or sooner if needed   Return in about 6 months (around 06/22/2019), or if symptoms worsen or fail to improve.    Thank you for the opportunity to care for this patient.  Please do not hesitate to contact me with questions.  Thermon LeylandAnne Ambs, FNP [facility]

## 2018-12-20 NOTE — Patient Instructions (Addendum)
Moderate Persistent Asthma -Continue Dulera 200 mcg two puffs twice daily with a spacer to prevent cough or wheeze - Albuterol 2 puffs every 4 hours as needed, and 30 minutes prior to physical activity -Continue Singulair 10 mg daily Asthma control goals:   Full participation in all desired activities (may need albuterol before activity)  Albuterol use two time or less a week on average (not counting use with activity)  Cough interfering with sleep two time or less a month  Oral steroids no more than once a year  No hospitalizations  Allergic rhinitis Continue Singulair as above Flonase 1-2 sprays in each nostril once a day as needed for a stuffy nose Consider nasal rinses as needed for nasal symptoms  Call the clinic if this treatment plan is not working well for you  Follow up in 6 months or sooner if needed
